# Patient Record
Sex: Male | Born: 1995 | Race: Black or African American | Hispanic: No | Marital: Single | Smoking: Never smoker
Health system: Southern US, Community
[De-identification: ages and names within clinical notes are randomized; demographics above are authoritative.]

---
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
## 2020-12-27 ENCOUNTER — Other Ambulatory Visit: Payer: Self-pay

## 2020-12-27 ENCOUNTER — Ambulatory Visit: Payer: Self-pay | Admitting: Physician Assistant

## 2020-12-27 ENCOUNTER — Encounter: Payer: Self-pay | Admitting: Physician Assistant

## 2020-12-27 DIAGNOSIS — Z113 Encounter for screening for infections with a predominantly sexual mode of transmission: Secondary | ICD-10-CM

## 2020-12-27 DIAGNOSIS — Z202 Contact with and (suspected) exposure to infections with a predominantly sexual mode of transmission: Secondary | ICD-10-CM

## 2020-12-27 LAB — HM HEPATITIS C SCREENING LAB: HM Hepatitis Screen: NEGATIVE

## 2020-12-27 LAB — GRAM STAIN

## 2020-12-27 LAB — HM HIV SCREENING LAB: HM HIV Screening: NEGATIVE

## 2020-12-27 MED ORDER — DOXYCYCLINE HYCLATE 100 MG PO TABS: 100.0000 mg | ORAL_TABLET | Freq: Two times a day (BID) | ORAL | 0 refills | Status: AC

## 2020-12-27 NOTE — Progress Notes (Signed)
[facility] Department STI clinic/screening visit  Subjective:  [name] is a 25 y.o. male being seen today for an STI screening visit. The patient reports they do have symptoms.    Patient has the following medical conditions:  There are no problems to display for this patient.    Chief Complaint  Patient presents with   SEXUALLY TRANSMITTED DISEASE    screening    HPI  Patient reports that he has had a "clogging feeling" and dysuria off and on for about 3 weeks.  States that he was also notified that he is a contact to Syphilis.  Denies any rash, sores, hair loss.  Denies chronic conditions, surgeries and regular medicines.  Reports last HIV test was in 2018 and last void prior to sample collection for Gram stain was 1.5 hr ago.    See flowsheet for further details and programmatic requirements.    The following portions of the patient's history were reviewed and updated as appropriate: allergies, current medications, past medical history, past social history, past surgical history and problem list.  Objective:  There were no vitals filed for this visit.  Physical Exam Constitutional:      General: He is not in acute distress.    Appearance: Normal appearance.  HENT:     Head: Normocephalic and atraumatic.     Comments: No nits,lice, or hair loss. No cervical, supraclavicular or axillary adenopathy.     Mouth/Throat:     Mouth: Mucous membranes are moist.     Pharynx: Oropharynx is clear. No oropharyngeal exudate or posterior oropharyngeal erythema.  Eyes:     Conjunctiva/sclera: Conjunctivae normal.  Pulmonary:     Effort: Pulmonary effort is normal.  Abdominal:     Palpations: Abdomen is soft. There is no mass.     Tenderness: There is no abdominal tenderness. There is no guarding or rebound.  Genitourinary:    Penis: Normal.      Testes: Normal.     Comments: Pubic area without nits, lice, hair loss, edema, erythema, lesions and inguinal  adenopathy. Penis circumcised without rash, lesions and discharge at meatus. Testicles descended bilaterally,nt, no masses or edema.  Musculoskeletal:     Cervical back: Neck supple. No tenderness.  Skin:    General: Skin is warm and dry.     Findings: No bruising, erythema, lesion or rash.  Neurological:     Mental Status: He is alert and oriented to person, place, and time.  Psychiatric:        Mood and Affect: Mood normal.        Behavior: Behavior normal.        Thought Content: Thought content normal.        Judgment: Judgment normal.      Assessment and Plan:  [name] is a 25 y.o. male presenting to the [facility] Department for STI screening  1. Screening for STD (sexually transmitted disease) Patient into clinic with symptoms. Reviewed with patient that Gram stain is normal.  Rec condoms with all sex. Await test results.  Counseled that RN will call if needs to RTC for treatment once results are back.  - Gram stain - Gonococcus culture - HIV/HCV [facility] - Syphilis Serology, [facility]  2. Syphilis contact Will treat as a contact to Syphilis with Doxycycline 100 mg #28 1 po BID for 14 days. No sex for 14 days and until after partner completes treatment. Call with questions or  concerns.  - doxycycline (VIBRA-TABS) 100 MG tablet; Take 1 tablet (100 mg total) by mouth 2 (two) times daily for 14 days.  Dispense: 28 tablet; Refill: 0     No follow-ups on file.  No future appointments.  Matt Holmes, PA

## 2020-12-31 LAB — GONOCOCCUS CULTURE

## 2021-01-01 NOTE — Progress Notes (Signed)
Chart reviewed by Pharmacist  Suzanne Walker PharmD, Contract Pharmacist at [facility]  

## 2021-11-28 ENCOUNTER — Encounter: Payer: Self-pay | Admitting: Nurse Practitioner

## 2021-11-28 ENCOUNTER — Ambulatory Visit: Payer: Self-pay | Admitting: Nurse Practitioner

## 2021-11-28 DIAGNOSIS — Z113 Encounter for screening for infections with a predominantly sexual mode of transmission: Secondary | ICD-10-CM

## 2021-11-28 DIAGNOSIS — N341 Nonspecific urethritis: Secondary | ICD-10-CM

## 2021-11-28 LAB — HM HIV SCREENING LAB: HM HIV Screening: NEGATIVE

## 2021-11-28 LAB — HEPATITIS B SURFACE ANTIGEN: Hepatitis B Surface Ag: NONREACTIVE

## 2021-11-28 LAB — HM HEPATITIS C SCREENING LAB: HM Hepatitis Screen: NEGATIVE

## 2021-11-28 LAB — GRAM STAIN

## 2021-11-28 MED ORDER — AZITHROMYCIN 500 MG PO TABS
1000.0000 mg | ORAL_TABLET | Freq: Once | ORAL | Status: AC
  Administered 2021-11-28: 1000 mg via ORAL

## 2021-11-28 MED ORDER — DOXYCYCLINE HYCLATE 100 MG PO TABS: 100.0000 mg | ORAL_TABLET | Freq: Two times a day (BID) | ORAL | 0 refills | Status: DC

## 2021-11-28 NOTE — Progress Notes (Unsigned)
Pt into clinic for STI screening.  Gram Stain results reviewed and treatment needed for NGU. Pt with allergy to PCN.  Per SO, pt administered Azithromycin 500mg , 2 tablets (total of 1gm.) po now.  Pt encouraged to eat as soon as leaves clinic and to return for retreatment if vomits within 2 hrs. Post treatment.  Pt verbalizes understanding of medication and plan of care.  Condoms , RN

## 2021-11-28 NOTE — Progress Notes (Signed)
[facility] Department STI clinic/screening visit  Subjective:  [name] is a 26 y.o. male being seen today for an STI screening visit. The patient reports they do have symptoms.    Patient has the following medical conditions:  There are no problems to display for this patient.    Chief Complaint  Patient presents with   SEXUALLY TRANSMITTED DISEASE    Screening-noticing discharge Nyoka Cowden)    HPI  Patient reports to clinic for STD screening.  Patient reports greenish discharge and burning when he urinates for one week.   Does the patient or their partner desires a pregnancy in the next year? No  Screening for MPX risk: Does the patient have an unexplained rash? No Is the patient MSM? No Does the patient endorse multiple sex partners or anonymous sex partners? No Did the patient have close or sexual contact with a person diagnosed with MPX? No Has the patient traveled outside the [place] where MPX is endemic? No Is there a high clinical suspicion for MPX-- evidenced by one of the following No  -Unlikely to be chickenpox  -Lymphadenopathy  -Rash that present in same phase of evolution on any given body part   See flowsheet for further details and programmatic requirements.   Immunization History  Administered Date(s) Administered   HPV 9-valent 09/13/2014   Hepatitis A 01/24/2006, 08/03/2013   Hepatitis B August 30, 1995, 05/13/1998, 11/07/1998   Hpv-Unspecified 08/03/2013   MMR 11/07/1998   Meningococcal Conjugate 08/03/2013, 09/13/2014   Tdap 12/28/2008   Varicella 11/07/1998, 01/24/2006     The following portions of the patient's history were reviewed and updated as appropriate: allergies, current medications, past medical history, past social history, past surgical history and problem list.  Objective:  There were no vitals filed for this visit.  Physical Exam Constitutional:      Appearance: Normal appearance.  HENT:     Head: Normocephalic. No  abrasion, masses or laceration. Hair is normal.     Right Ear: External ear normal.     Left Ear: External ear normal.     Nose: Nose normal.     Mouth/Throat:     Lips: Pink.     Mouth: Mucous membranes are moist. No oral lesions.     Dentition: No dental caries.     Pharynx: No pharyngeal swelling, oropharyngeal exudate, posterior oropharyngeal erythema or uvula swelling.     Tonsils: No tonsillar exudate or tonsillar abscesses.  Eyes:     General: Lids are normal.        Right eye: No discharge.        Left eye: No discharge.     Conjunctiva/sclera: Conjunctivae normal.     Right eye: No exudate.    Left eye: No exudate. Abdominal:     General: Abdomen is flat.     Palpations: Abdomen is soft.     Tenderness: There is no abdominal tenderness. There is no rebound.  Genitourinary:    Pubic Area: No rash or pubic lice.      Penis: Normal and circumcised. No erythema or discharge.      Testes: Normal.        Right: Mass or tenderness not present.        Left: Mass or tenderness not present.     Rectum: Normal.     Comments: Discharge amount: small Color: color of discharge matches  swab Musculoskeletal:     Cervical back: Full passive range of motion without pain, normal range  of motion and neck supple.  Lymphadenopathy:     Cervical: No cervical adenopathy.     Right cervical: No superficial, deep or posterior cervical adenopathy.    Left cervical: No superficial, deep or posterior cervical adenopathy.     Upper Body:     Right upper body: No supraclavicular, axillary or epitrochlear adenopathy.     Left upper body: No supraclavicular, axillary or epitrochlear adenopathy.     Lower Body: No right inguinal adenopathy. Left inguinal adenopathy present.  Skin:    General: Skin is warm and dry.     Findings: No lesion or rash.  Neurological:     Mental Status: He is alert and oriented to person, place, and time.  Psychiatric:        Attention and Perception: Attention  normal.        Mood and Affect: Mood normal.        Speech: Speech normal.        Behavior: Behavior normal. Behavior is cooperative.       Assessment and Plan:  [name] is a 26 y.o. male presenting to the [facility] Department for STI screening  1. Screening examination for venereal disease -26 year old male in clinic for STD screening. -Patient does have STI symptoms Patient accepted all screenings including  urine CT/GC and bloodwork for HIV/RPR.  Patient meets criteria for HepB screening? Yes. Ordered? Yes Patient meets criteria for HepC screening? Yes. Ordered? Yes Recommended condom use with all sex Discussed importance of condom use for STI prevent  Treat gram stain per standing order Discussed time line for State [facility] results and that patient will be called with positive results and encouraged patient to call if he had not heard in 2 weeks Recommended returning for continued or worsening symptoms.    - HIV/HCV [place] - Syphilis Serology, [facility] - HBV Antigen/Antibody [facility] - Chlamydia/GC NAA, Confirmation - Gram stain   2. NGU (nongonococcal urethritis) -Gram stain reviewed, treat patient for NGU.   -Advised patient not to have sex for 7 days and 7 days after partner is treated. - azithromycin (ZITHROMAX) tablet 1,000 mg   Return if symptoms worsen or fail to improve.  Total time spent: 30 minutes  Gregary Cromer, FNP

## 2021-12-01 LAB — CHLAMYDIA/GC NAA, CONFIRMATION
Chlamydia trachomatis, NAA: NEGATIVE
Neisseria gonorrhoeae, NAA: NEGATIVE

## 2022-08-30 ENCOUNTER — Emergency Department
Admission: EM | Admit: 2022-08-30 | Discharge: 2022-08-30 | Disposition: A | Discharge status: Home / Self Care | Payer: Self-pay | Attending: Emergency Medicine | Admitting: Emergency Medicine

## 2022-08-30 ENCOUNTER — Other Ambulatory Visit: Payer: Self-pay

## 2022-08-30 DIAGNOSIS — H1030 Unspecified acute conjunctivitis, unspecified eye: Secondary | ICD-10-CM

## 2022-08-30 DIAGNOSIS — H1032 Unspecified acute conjunctivitis, left eye: Secondary | ICD-10-CM | POA: Insufficient documentation

## 2022-08-30 MED ORDER — ERYTHROMYCIN 5 MG/GM OP OINT: 1.0000 | TOPICAL_OINTMENT | Freq: Three times a day (TID) | OPHTHALMIC | 0 refills | Status: AC

## 2022-08-30 NOTE — ED Triage Notes (Signed)
Pt comes with c/o left eye pain and swelling. Pt states redness. Pt states this all started Sunday.

## 2022-08-30 NOTE — ED Provider Notes (Signed)
   [facility] Provider Note    Event Date/Time   First MD Initiated Contact with Patient 08/30/22 1434     (approximate)   History   Eye Pain   HPI  [name] is a 27 y.o. male with no significant past medical history presents emergency department complaining of left eye redness, matting and drainage.  Symptoms for 3 days.  Patient states no change in his vision.  No known injury.  No fever or chills.      Physical Exam   Triage Vital Signs: ED Triage Vitals [08/30/22 1433]  Enc Vitals Group     BP (!) 152/88     Pulse Rate 77     Resp 18     Temp 98.3 F (36.8 C)     Temp src      SpO2 100 %     Weight      Height      Head Circumference      Peak Flow      Pain Score 2     Pain Loc      Pain Edu?      Excl. in GC?     Most recent vital signs: Vitals:   08/30/22 1433  BP: (!) 152/88  Pulse: 77  Resp: 18  Temp: 98.3 F (36.8 C)  SpO2: 100%     General: Awake, no distress.   CV:  Good peripheral perfusion. regular rate and  rhythm Resp:  Normal effort. Lungs cta Abd:  No distention.   Other:  PERRL, EOMI, conjunctiva are injected, some matting noted on the lashes   ED Results / Procedures / Treatments   Labs (all labs ordered are listed, but only abnormal results are displayed) Labs Reviewed - No data to display   EKG     RADIOLOGY     PROCEDURES:   Procedures   MEDICATIONS ORDERED IN ED: Medications - No data to display   IMPRESSION / MDM / ASSESSMENT AND PLAN / ED COURSE  I reviewed the triage vital signs and the nursing notes.                              Differential diagnosis includes, but is not limited to, conjunctivitis, corneal abrasion, cellulitis  Patient's presentation is most consistent with acute, uncomplicated illness.   I did evaluate patient.  He was given a prescription for erythromycin ophthalmic ointment as he is allergic to penicillin and sulfa.  He is to follow-up with  [facility] if not improving in 3 days.  Return emergency department worsening.  Patient is in agreement treatment plan.  Discharged stable condition.      FINAL CLINICAL IMPRESSION(S) / ED DIAGNOSES   Final diagnoses:  Acute conjunctivitis, unspecified acute conjunctivitis type, unspecified laterality     Rx / DC Orders   ED Discharge Orders          Ordered    erythromycin ophthalmic ointment  3 times daily        08/30/22 1436             Note:  This document was prepared using Dragon voice recognition software and may include unintentional dictation errors.    Faythe Ghee, PA-C 08/30/22 1511    Sharyn Creamer, MD 08/30/22 1540

## 2022-12-04 ENCOUNTER — Ambulatory Visit: Payer: Self-pay | Admitting: Family Medicine

## 2022-12-04 DIAGNOSIS — N341 Nonspecific urethritis: Secondary | ICD-10-CM

## 2022-12-04 DIAGNOSIS — Z113 Encounter for screening for infections with a predominantly sexual mode of transmission: Secondary | ICD-10-CM

## 2022-12-04 LAB — GRAM STAIN

## 2022-12-04 LAB — HM HIV SCREENING LAB: HM HIV Screening: NEGATIVE

## 2022-12-04 MED ORDER — DOXYCYCLINE HYCLATE 100 MG PO TABS: 100.0000 mg | ORAL_TABLET | Freq: Two times a day (BID) | ORAL | Status: AC

## 2022-12-04 NOTE — Progress Notes (Signed)
Pt here for std screening.  The patient was dispensed doxycycline today. I provided counseling today regarding the medication. We discussed the medication, the side effects and when to call clinic. Patient given the opportunity to ask questions. Questions answered.  Gaspar Garbe, RN

## 2022-12-04 NOTE — Addendum Note (Signed)
Addended by: Lenice Llamas on: 12/04/2022 03:34 PM   Modules accepted: Orders

## 2022-12-04 NOTE — Progress Notes (Signed)
[facility] Department STI clinic/screening visit  Subjective:  [name] is a 27 y.o. male being seen today for an STI screening visit. The patient reports they do have symptoms.    Patient has the following medical conditions:  There are no problems to display for this patient.    Chief Complaint  Patient presents with   SEXUALLY TRANSMITTED DISEASE    HPI  Patient reports  to clinic with a 2 week hx of dysuria  Last HIV test per patient/review of record was  Lab Results  Component Value Date   HMHIVSCREEN Negative - Validated 11/28/2021   No results found for: "HIV"  Does the patient or their partner desires a pregnancy in the next year? No  Screening for MPX risk: Does the patient have an unexplained rash? No Is the patient MSM? No Does the patient endorse multiple sex partners or anonymous sex partners? No Did the patient have close or sexual contact with a person diagnosed with MPX? No Has the patient traveled outside the [place] where MPX is endemic? No Is there a high clinical suspicion for MPX-- evidenced by one of the following No  -Unlikely to be chickenpox  -Lymphadenopathy  -Rash that present in same phase of evolution on any given body part   See flowsheet for further details and programmatic requirements.   Immunization History  Administered Date(s) Administered   HPV 9-valent 09/13/2014   Hepatitis A 01/24/2006, 08/03/2013   Hepatitis B 03/25/1998, 05/13/1998, 11/07/1998   Hpv-Unspecified 08/03/2013   MMR 11/07/1998   Meningococcal Conjugate 08/03/2013, 09/13/2014   Tdap 12/28/2008   Varicella 11/07/1998, 01/24/2006     The following portions of the patient's history were reviewed and updated as appropriate: allergies, current medications, past medical history, past social history, past surgical history and problem list.  Objective:  There were no vitals filed for this visit.  Physical Exam Constitutional:      Appearance:  Normal appearance.  HENT:     Head: Normocephalic and atraumatic.     Comments: No nits or hair loss    Mouth/Throat:     Mouth: Mucous membranes are moist. No oral lesions.     Pharynx: Oropharynx is clear. No oropharyngeal exudate or posterior oropharyngeal erythema.  Eyes:     General:        Right eye: No discharge.        Left eye: No discharge.     Conjunctiva/sclera:     Right eye: Right conjunctiva is not injected. No exudate.    Left eye: Left conjunctiva is not injected. No exudate. Pulmonary:     Effort: Pulmonary effort is normal.  Abdominal:     General: Abdomen is flat.     Palpations: Abdomen is soft. There is no hepatomegaly or mass.     Tenderness: There is no abdominal tenderness. There is no rebound.     Hernia: There is no hernia in the left inguinal area or right inguinal area.  Genitourinary:    Pubic Area: No rash or pubic lice (no nits).      Penis: Normal. No tenderness, discharge, swelling or lesions.      Testes: Normal.     Epididymis:     Right: Normal. No mass or tenderness.     Left: Normal. No mass or tenderness.     Rectum: Normal. No tenderness (no lesions or discharge).     Comments: Penile Discharge Amount: none Color:  none Lymphadenopathy:  Head:     Right side of head: No preauricular or posterior auricular adenopathy.     Left side of head: No preauricular or posterior auricular adenopathy.     Cervical: No cervical adenopathy.     Upper Body:     Right upper body: No supraclavicular, axillary or epitrochlear adenopathy.     Left upper body: No supraclavicular, axillary or epitrochlear adenopathy.     Lower Body: No right inguinal adenopathy. No left inguinal adenopathy.  Skin:    General: Skin is warm and dry.     Findings: No lesion or rash.  Neurological:     Mental Status: He is alert and oriented to person, place, and time.       Assessment and Plan:  ARYN HAFFEY is a 27 y.o. male presenting to the [facility] Department for STI screening  1. Screening for venereal disease  - [facility] - Syphilis Serology, [facility] - Gram stain - Chlamydia/GC NAA, Confirmation   Patient does have STI symptoms Patient accepted all screenings including  urine GC/Chlamydia, and blood work for HIV/Syphilis. Patient meets criteria for HepB screening? No. Ordered? not applicable Patient meets criteria for HepC screening? No. Ordered? not applicable Recommended condom use with all sex Discussed importance of condom use for STI prevent  Treat positive test results per standing order. Discussed time line for [facility] results and that patient will be called with positive results and encouraged patient to call if he had not heard in 2 weeks Recommended repeat testing in 3 months with positive results. Recommended returning for continued or worsening symptoms.   Return if symptoms worsen or fail to improve, for STI screening.  No future appointments. Total time spent 20 minutes  Lenice Llamas, [place]
